# Patient Record
Sex: Female | Born: 1985 | Race: Black or African American | Hispanic: No | Marital: Single | Smoking: Current some day smoker
Health system: Southern US, Community
[De-identification: ages and names within clinical notes are randomized; demographics above are authoritative.]

## PROBLEM LIST (undated history)

## (undated) DIAGNOSIS — Z789 Other specified health status: Secondary | ICD-10-CM

## (undated) HISTORY — PX: NO PAST SURGERIES: SHX2092

---
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
## 2009-11-01 ENCOUNTER — Emergency Department (HOSPITAL_COMMUNITY): Admission: EM | Admit: 2009-11-01 | Discharge: 2009-11-02 | Payer: Self-pay | Admitting: Emergency Medicine

## 2009-12-03 ENCOUNTER — Emergency Department (HOSPITAL_COMMUNITY): Admission: EM | Admit: 2009-12-03 | Discharge: 2009-12-03 | Payer: Self-pay | Admitting: Emergency Medicine

## 2010-08-12 ENCOUNTER — Emergency Department (HOSPITAL_COMMUNITY)
Admission: EM | Admit: 2010-08-12 | Discharge: 2010-08-12 | Payer: Self-pay | Source: Home / Self Care | Admitting: Family Medicine

## 2010-11-14 LAB — WET PREP, GENITAL
Clue Cells Wet Prep HPF POC: NONE SEEN
Yeast Wet Prep HPF POC: NONE SEEN

## 2010-11-14 LAB — GC/CHLAMYDIA PROBE AMP, GENITAL
Chlamydia, DNA Probe: NEGATIVE
GC Probe Amp, Genital: NEGATIVE

## 2010-11-14 LAB — POCT URINALYSIS DIPSTICK
Bilirubin Urine: NEGATIVE
Glucose, UA: NEGATIVE mg/dL
Ketones, ur: NEGATIVE mg/dL
Nitrite: NEGATIVE
Protein, ur: NEGATIVE mg/dL
Specific Gravity, Urine: 1.02 (ref 1.005–1.030)
Urobilinogen, UA: 0.2 mg/dL (ref 0.0–1.0)
pH: 7 (ref 5.0–8.0)

## 2010-11-23 LAB — URINALYSIS, ROUTINE W REFLEX MICROSCOPIC
Bilirubin Urine: NEGATIVE
Ketones, ur: NEGATIVE mg/dL
Nitrite: POSITIVE — AB
Protein, ur: NEGATIVE mg/dL
Specific Gravity, Urine: 1.01 (ref 1.005–1.030)
Urobilinogen, UA: 0.2 mg/dL (ref 0.0–1.0)

## 2010-11-23 LAB — WET PREP, GENITAL
Trich, Wet Prep: NONE SEEN
Yeast Wet Prep HPF POC: NONE SEEN

## 2010-11-23 LAB — GC/CHLAMYDIA PROBE AMP, GENITAL
Chlamydia, DNA Probe: NEGATIVE
GC Probe Amp, Genital: NEGATIVE

## 2010-11-28 LAB — URINALYSIS, ROUTINE W REFLEX MICROSCOPIC
Bilirubin Urine: NEGATIVE
Ketones, ur: 15 mg/dL — AB
Nitrite: POSITIVE — AB
Protein, ur: NEGATIVE mg/dL
Specific Gravity, Urine: 1.022 (ref 1.005–1.030)
Urobilinogen, UA: 0.2 mg/dL (ref 0.0–1.0)

## 2010-11-28 LAB — POCT I-STAT, CHEM 8
BUN: 7 mg/dL (ref 6–23)
Creatinine, Ser: 1 mg/dL (ref 0.4–1.2)
Glucose, Bld: 89 mg/dL (ref 70–99)
Potassium: 3.7 mEq/L (ref 3.5–5.1)
Sodium: 137 mEq/L (ref 135–145)

## 2010-11-28 LAB — URINE MICROSCOPIC-ADD ON

## 2010-11-28 LAB — DIFFERENTIAL
Eosinophils Relative: 1 % (ref 0–5)
Lymphocytes Relative: 31 % (ref 12–46)
Lymphs Abs: 2 10*3/uL (ref 0.7–4.0)
Monocytes Relative: 7 % (ref 3–12)

## 2010-11-28 LAB — CBC
HCT: 41.7 % (ref 36.0–46.0)
Platelets: 353 10*3/uL (ref 150–400)
RBC: 4.57 MIL/uL (ref 3.87–5.11)
WBC: 6.4 10*3/uL (ref 4.0–10.5)

## 2010-11-28 LAB — GC/CHLAMYDIA PROBE AMP, GENITAL: GC Probe Amp, Genital: NEGATIVE

## 2010-11-28 LAB — WET PREP, GENITAL
Trich, Wet Prep: NONE SEEN
Yeast Wet Prep HPF POC: NONE SEEN

## 2010-12-23 ENCOUNTER — Inpatient Hospital Stay (INDEPENDENT_AMBULATORY_CARE_PROVIDER_SITE_OTHER)
Admission: RE | Admit: 2010-12-23 | Discharge: 2010-12-23 | Disposition: A | Discharge status: Home / Self Care | Payer: PRIVATE HEALTH INSURANCE | Source: Ambulatory Visit | Attending: Family Medicine | Admitting: Family Medicine

## 2010-12-23 DIAGNOSIS — N898 Other specified noninflammatory disorders of vagina: Secondary | ICD-10-CM

## 2010-12-23 DIAGNOSIS — N390 Urinary tract infection, site not specified: Secondary | ICD-10-CM

## 2010-12-23 LAB — POCT URINALYSIS DIP (DEVICE)
Nitrite: POSITIVE — AB
Protein, ur: >=300 mg/dL — AB
Urobilinogen, UA: 2 mg/dL — ABNORMAL HIGH (ref 0.0–1.0)
pH: 5.5 (ref 5.0–8.0)

## 2010-12-24 LAB — URINE CULTURE

## 2011-04-08 ENCOUNTER — Encounter (HOSPITAL_COMMUNITY): Payer: Self-pay | Admitting: Obstetrics and Gynecology

## 2011-04-08 ENCOUNTER — Inpatient Hospital Stay (HOSPITAL_COMMUNITY)
Admission: AD | Admit: 2011-04-08 | Discharge: 2011-04-08 | Disposition: A | Discharge status: Home / Self Care | Payer: Self-pay | Source: Ambulatory Visit | Attending: Obstetrics & Gynecology | Admitting: Obstetrics & Gynecology

## 2011-04-08 DIAGNOSIS — N750 Cyst of Bartholin's gland: Secondary | ICD-10-CM | POA: Insufficient documentation

## 2011-04-08 HISTORY — DX: Other specified health status: Z78.9

## 2011-04-08 LAB — URINALYSIS, ROUTINE W REFLEX MICROSCOPIC
Ketones, ur: NEGATIVE mg/dL
Leukocytes, UA: NEGATIVE
Nitrite: NEGATIVE
Specific Gravity, Urine: 1.02 (ref 1.005–1.030)
pH: 8.5 — ABNORMAL HIGH (ref 5.0–8.0)

## 2011-04-08 LAB — WET PREP, GENITAL
Clue Cells Wet Prep HPF POC: NONE SEEN
Trich, Wet Prep: NONE SEEN

## 2011-04-08 LAB — URINE MICROSCOPIC-ADD ON

## 2011-04-08 MED ORDER — DOXYCYCLINE HYCLATE 100 MG PO CAPS: 100.0000 mg | ORAL_CAPSULE | Freq: Two times a day (BID) | ORAL | Status: AC

## 2011-04-08 NOTE — Progress Notes (Signed)
Earlier last week had pain in rectal area now has lumps in vaginal area, painful, had some discharge earlier this week, none at present

## 2011-04-08 NOTE — Progress Notes (Signed)
Pt presents to [facility] with Lumps in vaginal area. Pt noticed the lumps 2 days ago. Pt states she is sexually active- pt states she uses condoms. Pt describes the lumps as very painful. They are painful when she walks, and when she has a bowel movement.

## 2011-04-08 NOTE — ED Provider Notes (Addendum)
History     Chief Complaint  Patient presents with  . Groin Pain   HPI  Patient presents with complaints of 2 knots in her vagina.  Reports as "sore" when she walks and sits.  Denis vaginal bleeding.  Saw discharge 2 days ago.  Has 1 partner but not sure is she is his only partner.  Last intercourse 2 weeks ago.  LMP end of June.  Not using birth control.     Past Medical History  Diagnosis Date  . No pertinent past medical history     Past Surgical History  Procedure Date  . No past surgeries     No family history on file.  History  Substance Use Topics  . Smoking status: Never Smoker   . Smokeless tobacco: Not on file  . Alcohol Use: Yes     "drink from time to time"    Allergies: No Known Allergies  Prescriptions prior to admission  Medication Sig Dispense Refill  . Ibuprofen (ADVIL PO) Take 2 tablets by mouth daily as needed. For headache.         Review of Systems  Constitutional: Negative.   Gastrointestinal: Negative for nausea and vomiting.  Genitourinary: Negative.        Vaginal discharge without odor 2 days ago Neg for vaginal bleeding.   Physical Exam   Blood pressure 117/75, pulse 85, temperature 99.9 F (37.7 C), temperature source Oral, resp. rate 16, height 5' 4.25" (1.632 m), weight 207 lb 12.8 oz (94.257 kg), last menstrual period 03/04/2011.  Physical Exam  Constitutional: She is oriented to person, place, and time. She appears well-developed and well-nourished.  Neck: Neck supple.  Respiratory: Effort normal.  GI: Soft. She exhibits no distension. There is no tenderness.  Genitourinary:    Uterus is tender. Uterus is not enlarged. Cervix exhibits friability. Cervix exhibits no motion tenderness and no discharge. No bleeding around the vagina. Vaginal discharge found.       Right nodule that is tender, slightly enlarged and firm.  Drainage not noted. Left Bartholins cyst --small area of bleeding.  It is flat and appears to have already  drained on its own.  It is tender to touch.    Lymphadenopathy:       Right: No inguinal adenopathy present.       Left: No inguinal adenopathy present.  Neurological: She is alert and oriented to person, place, and time.  Skin: Skin is warm and dry.    MAU Course  Procedures     Results for orders placed during the hospital encounter of 04/08/11 (from the past 24 hour(s))  URINALYSIS, ROUTINE W REFLEX MICROSCOPIC     Status: Abnormal   Collection Time   04/08/11 12:18 PM      Component Value Range   Color, Urine YELLOW  YELLOW    Appearance CLEAR  CLEAR    Specific Gravity, Urine 1.020  1.005 - 1.030    pH 8.5 (*) 5.0 - 8.0    Glucose, UA NEGATIVE  NEGATIVE (mg/dL)   Hgb urine dipstick SMALL (*) NEGATIVE    Bilirubin Urine NEGATIVE  NEGATIVE    Ketones, ur NEGATIVE  NEGATIVE (mg/dL)   Protein, ur NEGATIVE  NEGATIVE (mg/dL)   Urobilinogen, UA 0.2  0.0 - 1.0 (mg/dL)   Nitrite NEGATIVE  NEGATIVE    Leukocytes, UA NEGATIVE  NEGATIVE   URINE MICROSCOPIC-ADD ON     Status: Normal   Collection Time   04/08/11 12:18 PM  Component Value Range   Squamous Epithelial / LPF RARE  RARE    WBC, UA 0-2  <3 (WBC/hpf)   RBC / HPF 3-6  <3 (RBC/hpf)   Bacteria, UA RARE  RARE   POCT PREGNANCY, URINE     Status: Normal   Collection Time   04/08/11 12:21 PM      Component Value Range   Preg Test, Ur NEGATIVE    WET PREP, GENITAL     Status: Abnormal   Collection Time   04/08/11  1:07 PM      Component Value Range   Yeast, Wet Prep NONE SEEN  NONE SEEN    Trich, Wet Prep NONE SEEN  NONE SEEN    Clue Cells, Wet Prep NONE SEEN  NONE SEEN    WBC, Wet Prep HPF POC MANY (*) NONE SEEN      MDM   Assessment and Plan  Bartholin's Gland Cysts bilaterally    Doxycycline 100mg  po bid for 10 days Warm soaks 3-4 X day until resolved  Mianna Iezzi,EVE M 04/08/2011, 1:00 PM   Matt Holmes, NP 04/08/11 1721

## 2011-04-11 LAB — GC/CHLAMYDIA PROBE AMP, GENITAL: GC Probe Amp, Genital: POSITIVE — AB

## 2011-05-13 ENCOUNTER — Inpatient Hospital Stay (INDEPENDENT_AMBULATORY_CARE_PROVIDER_SITE_OTHER)
Admission: RE | Admit: 2011-05-13 | Discharge: 2011-05-13 | Disposition: A | Discharge status: Home / Self Care | Payer: BC Managed Care – PPO | Source: Ambulatory Visit | Attending: Emergency Medicine | Admitting: Emergency Medicine

## 2011-05-13 ENCOUNTER — Ambulatory Visit (INDEPENDENT_AMBULATORY_CARE_PROVIDER_SITE_OTHER): Payer: BC Managed Care – PPO

## 2011-05-13 DIAGNOSIS — M2550 Pain in unspecified joint: Secondary | ICD-10-CM

## 2011-05-13 LAB — POCT PREGNANCY, URINE: Preg Test, Ur: NEGATIVE

## 2011-05-28 ENCOUNTER — Inpatient Hospital Stay (INDEPENDENT_AMBULATORY_CARE_PROVIDER_SITE_OTHER)
Admission: RE | Admit: 2011-05-28 | Discharge: 2011-05-28 | Disposition: A | Discharge status: Home / Self Care | Payer: BC Managed Care – PPO | Source: Ambulatory Visit | Attending: Emergency Medicine | Admitting: Emergency Medicine

## 2011-05-28 DIAGNOSIS — R51 Headache: Secondary | ICD-10-CM

## 2011-10-20 ENCOUNTER — Encounter: Payer: Self-pay | Admitting: Obstetrics and Gynecology

## 2011-11-21 ENCOUNTER — Other Ambulatory Visit: Payer: Self-pay

## 2011-11-21 ENCOUNTER — Encounter: Payer: Self-pay | Admitting: Obstetrics and Gynecology

## 2011-12-07 ENCOUNTER — Encounter (INDEPENDENT_AMBULATORY_CARE_PROVIDER_SITE_OTHER): Payer: BC Managed Care – PPO | Admitting: Obstetrics and Gynecology

## 2011-12-07 ENCOUNTER — Encounter (INDEPENDENT_AMBULATORY_CARE_PROVIDER_SITE_OTHER): Payer: BC Managed Care – PPO

## 2011-12-07 DIAGNOSIS — N949 Unspecified condition associated with female genital organs and menstrual cycle: Secondary | ICD-10-CM

## 2011-12-07 DIAGNOSIS — E282 Polycystic ovarian syndrome: Secondary | ICD-10-CM

## 2013-10-13 ENCOUNTER — Emergency Department (HOSPITAL_COMMUNITY)
Admission: EM | Admit: 2013-10-13 | Discharge: 2013-10-13 | Disposition: A | Discharge status: Home / Self Care | Payer: BC Managed Care – PPO | Source: Home / Self Care | Attending: Family Medicine | Admitting: Family Medicine

## 2013-10-13 ENCOUNTER — Encounter (HOSPITAL_COMMUNITY): Payer: Self-pay | Admitting: Emergency Medicine

## 2013-10-13 DIAGNOSIS — J029 Acute pharyngitis, unspecified: Secondary | ICD-10-CM

## 2013-10-13 LAB — POCT RAPID STREP A: Streptococcus, Group A Screen (Direct): NEGATIVE

## 2013-10-13 MED ORDER — PENICILLIN V POTASSIUM 500 MG PO TABS: 500.0000 mg | ORAL_TABLET | Freq: Four times a day (QID) | ORAL | Status: DC

## 2013-10-13 NOTE — ED Provider Notes (Signed)
CSN: [id number]     Arrival date & time 10/13/13  1047 History   First MD Initiated Contact with Patient 10/13/13 1125     No chief complaint on file.    (Consider location/radiation/quality/duration/timing/severity/associated sxs/prior Treatment) Patient is a 28 y.o. female presenting with pharyngitis. The history is provided by the patient. No language interpreter was used.  Sore Throat This is a new problem. The problem occurs constantly. The problem has been gradually worsening. Pertinent negatives include no shortness of breath. Nothing aggravates the symptoms. Nothing relieves the symptoms. She has tried nothing for the symptoms. The treatment provided no relief.    Past Medical History  Diagnosis Date  . No pertinent past medical history    Past Surgical History  Procedure Laterality Date  . No past surgeries     No family history on file. History  Substance Use Topics  . Smoking status: Never Smoker   . Smokeless tobacco: Not on file  . Alcohol Use: Yes     Comment: "drink from time to time"   OB History   Grav Para Term Preterm Abortions TAB SAB Ect Mult Living   0 0 0 0 0 0 0 0 0 0      Review of Systems  HENT: Positive for ear pain and sore throat.   Respiratory: Positive for cough. Negative for shortness of breath.   All other systems reviewed and are negative.      Allergies  Review of patient's allergies indicates no known allergies.  Home Medications   Current Outpatient Rx  Name  Route  Sig  Dispense  Refill  . Ibuprofen (ADVIL PO)   Oral   Take 2 tablets by mouth daily as needed. For headache.           BP 124/59  Pulse 78  Temp(Src) 98.2 F (36.8 C) (Oral)  Resp 16  SpO2 100% Physical Exam  Nursing note and vitals reviewed. Constitutional: She is oriented to person, place, and time. She appears well-developed and well-nourished.  HENT:  Head: Normocephalic.  Right Ear: External ear normal.  Left Ear: External ear normal.  Throat  erythematous  Eyes: Conjunctivae and EOM are normal. Pupils are equal, round, and reactive to light.  Neck: Normal range of motion.  Pulmonary/Chest: Effort normal.  Abdominal: She exhibits no distension.  Musculoskeletal: Normal range of motion.  Neurological: She is alert and oriented to person, place, and time.  Skin: Skin is warm.  Psychiatric: She has a normal mood and affect.    ED Course  Procedures (including critical care time) Labs Review Labs Reviewed - No data to display Imaging Review No results found.    MDM   Final diagnoses:Pharyngitis  None    pcn vk [id number] qid    Lonia SkinnerLeslie K NezperceSofia, PA-C 10/13/13 1208  Lonia SkinnerLeslie K LykensSofia, [place] JerseyPA-C 10/13/13 1211

## 2013-10-13 NOTE — Discharge Instructions (Signed)
Sore Throat A sore throat is pain, burning, irritation, or scratchiness of the throat. There is often pain or tenderness when swallowing or talking. A sore throat may be accompanied by other symptoms, such as coughing, sneezing, fever, and swollen neck glands. A sore throat is often the first sign of another sickness, such as a cold, flu, strep throat, or mononucleosis (commonly known as mono). Most sore throats go away without medical treatment. CAUSES  The most common causes of a sore throat include:  A viral infection, such as a cold, flu, or mono.  A bacterial infection, such as strep throat, tonsillitis, or whooping cough.  Seasonal allergies.  Dryness in the air.  Irritants, such as smoke or pollution.  Gastroesophageal reflux disease (GERD). HOME CARE INSTRUCTIONS   Only take over-the-counter medicines as directed by your caregiver.  Drink enough fluids to keep your urine clear or pale yellow.  Rest as needed.  Try using throat sprays, lozenges, or sucking on hard candy to ease any pain (if older than 4 years or as directed).  Sip warm liquids, such as broth, herbal tea, or warm water with honey to relieve pain temporarily. You may also eat or drink cold or frozen liquids such as frozen ice pops.  Gargle with salt water (mix 1 tsp salt with 8 oz of water).  Do not smoke and avoid secondhand smoke.  Put a cool-mist humidifier in your bedroom at night to moisten the air. You can also turn on a hot shower and sit in the bathroom with the door closed for 5 10 minutes. SEEK IMMEDIATE MEDICAL CARE IF:  You have difficulty breathing.  You are unable to swallow fluids, soft foods, or your saliva.  You have increased swelling in the throat.  Your sore throat does not get better in 7 days.  You have nausea and vomiting.  You have a fever or persistent symptoms for more than 2 3 days.  You have a fever and your symptoms suddenly get worse. MAKE SURE YOU:   Understand  these instructions.  Will watch your condition.  Will get help right away if you are not doing well or get worse. Document Released: 09/28/2004 Document Revised: 08/07/2012 Document Reviewed: 04/28/2012 Exit[employer] Patient Information 2014 [employer].  

## 2013-10-13 NOTE — ED Notes (Signed)
Pt  Reports           sorethroat    Headache   As   Well              Clogged  Ears        The  sorethroat               Has  Been  For  About  1  Week

## 2013-10-15 LAB — CULTURE, GROUP A STREP

## 2013-10-15 NOTE — ED Provider Notes (Signed)
Medical screening examination/treatment/procedure(s) were performed by a resident physician or non-physician practitioner and as the supervising physician I was immediately available for consultation/collaboration.  Latesa Fratto, MD    Boneta Standre S Nahom Carfagno, MD 10/15/13 0750 

## 2014-04-11 ENCOUNTER — Encounter (HOSPITAL_COMMUNITY): Payer: Self-pay | Admitting: Emergency Medicine

## 2014-04-11 ENCOUNTER — Emergency Department (HOSPITAL_COMMUNITY)
Admission: EM | Admit: 2014-04-11 | Discharge: 2014-04-11 | Disposition: A | Discharge status: Home / Self Care | Payer: BC Managed Care – PPO | Attending: Emergency Medicine | Admitting: Emergency Medicine

## 2014-04-11 ENCOUNTER — Emergency Department (INDEPENDENT_AMBULATORY_CARE_PROVIDER_SITE_OTHER)
Admission: EM | Admit: 2014-04-11 | Discharge: 2014-04-11 | Disposition: A | Discharge status: Nursing Home (Includes ICF) | Payer: BC Managed Care – PPO | Source: Home / Self Care | Attending: Emergency Medicine | Admitting: Emergency Medicine

## 2014-04-11 ENCOUNTER — Emergency Department (HOSPITAL_COMMUNITY): Payer: BC Managed Care – PPO

## 2014-04-11 DIAGNOSIS — R000 Tachycardia, unspecified: Secondary | ICD-10-CM | POA: Insufficient documentation

## 2014-04-11 DIAGNOSIS — E669 Obesity, unspecified: Secondary | ICD-10-CM | POA: Insufficient documentation

## 2014-04-11 DIAGNOSIS — R599 Enlarged lymph nodes, unspecified: Secondary | ICD-10-CM | POA: Insufficient documentation

## 2014-04-11 DIAGNOSIS — K122 Cellulitis and abscess of mouth: Secondary | ICD-10-CM

## 2014-04-11 DIAGNOSIS — R509 Fever, unspecified: Secondary | ICD-10-CM | POA: Insufficient documentation

## 2014-04-11 DIAGNOSIS — R609 Edema, unspecified: Secondary | ICD-10-CM | POA: Insufficient documentation

## 2014-04-11 DIAGNOSIS — R51 Headache: Secondary | ICD-10-CM | POA: Insufficient documentation

## 2014-04-11 DIAGNOSIS — J029 Acute pharyngitis, unspecified: Secondary | ICD-10-CM | POA: Insufficient documentation

## 2014-04-11 DIAGNOSIS — S01532A Puncture wound without foreign body of oral cavity, initial encounter: Secondary | ICD-10-CM

## 2014-04-11 DIAGNOSIS — K140 Glossitis: Secondary | ICD-10-CM | POA: Insufficient documentation

## 2014-04-11 LAB — I-STAT CHEM 8, ED
BUN: 8 mg/dL (ref 6–23)
CALCIUM ION: 1.18 mmol/L (ref 1.12–1.23)
Chloride: 103 mEq/L (ref 96–112)
Creatinine, Ser: 0.8 mg/dL (ref 0.50–1.10)
Glucose, Bld: 81 mg/dL (ref 70–99)
HEMATOCRIT: 46 % (ref 36.0–46.0)
Hemoglobin: 15.6 g/dL — ABNORMAL HIGH (ref 12.0–15.0)
Potassium: 3.6 mEq/L — ABNORMAL LOW (ref 3.7–5.3)
Sodium: 140 mEq/L (ref 137–147)
TCO2: 23 mmol/L (ref 0–100)

## 2014-04-11 LAB — CBC WITH DIFFERENTIAL/PLATELET
BASOS ABS: 0 10*3/uL (ref 0.0–0.1)
BASOS PCT: 0 % (ref 0–1)
EOS PCT: 1 % (ref 0–5)
Eosinophils Absolute: 0.1 10*3/uL (ref 0.0–0.7)
HEMATOCRIT: 40.2 % (ref 36.0–46.0)
Hemoglobin: 13.6 g/dL (ref 12.0–15.0)
Lymphocytes Relative: 24 % (ref 12–46)
Lymphs Abs: 2.5 10*3/uL (ref 0.7–4.0)
MCH: 28.1 pg (ref 26.0–34.0)
MCHC: 33.8 g/dL (ref 30.0–36.0)
MCV: 83.1 fL (ref 78.0–100.0)
MONO ABS: 0.6 10*3/uL (ref 0.1–1.0)
Monocytes Relative: 6 % (ref 3–12)
Neutro Abs: 7 10*3/uL (ref 1.7–7.7)
Neutrophils Relative %: 69 % (ref 43–77)
Platelets: 380 10*3/uL (ref 150–400)
RBC: 4.84 MIL/uL (ref 3.87–5.11)
RDW: 13.3 % (ref 11.5–15.5)
WBC: 10.2 10*3/uL (ref 4.0–10.5)

## 2014-04-11 LAB — I-STAT CG4 LACTIC ACID, ED: LACTIC ACID, VENOUS: 1.26 mmol/L (ref 0.5–2.2)

## 2014-04-11 LAB — POCT RAPID STREP A: STREPTOCOCCUS, GROUP A SCREEN (DIRECT): NEGATIVE

## 2014-04-11 MED ORDER — IOHEXOL 300 MG/ML  SOLN
75.0000 mL | Freq: Once | INTRAMUSCULAR | Status: AC | PRN
  Administered 2014-04-11: 75 mL via INTRAVENOUS

## 2014-04-11 MED ORDER — CLINDAMYCIN PHOSPHATE 600 MG/50ML IV SOLN
600.0000 mg | Freq: Once | INTRAVENOUS | Status: AC
  Administered 2014-04-11: 600 mg via INTRAVENOUS
  Filled 2014-04-11: qty 50

## 2014-04-11 MED ORDER — ONDANSETRON HCL 4 MG/2ML IJ SOLN
4.0000 mg | Freq: Once | INTRAMUSCULAR | Status: AC
  Administered 2014-04-11: 4 mg via INTRAVENOUS
  Filled 2014-04-11: qty 2

## 2014-04-11 MED ORDER — CLINDAMYCIN HCL 150 MG PO CAPS: 150.0000 mg | ORAL_CAPSULE | Freq: Four times a day (QID) | ORAL | Status: AC

## 2014-04-11 MED ORDER — HYDROCODONE-ACETAMINOPHEN 5-325 MG PO TABS: 1.0000 | ORAL_TABLET | Freq: Four times a day (QID) | ORAL | Status: AC | PRN

## 2014-04-11 MED ORDER — MORPHINE SULFATE 4 MG/ML IJ SOLN
4.0000 mg | Freq: Once | INTRAMUSCULAR | Status: AC
  Administered 2014-04-11: 4 mg via INTRAVENOUS
  Filled 2014-04-11: qty 1

## 2014-04-11 MED ORDER — LIDOCAINE-EPINEPHRINE-TETRACAINE (LET) SOLUTION
3.0000 mL | Freq: Once | NASAL | Status: AC
  Administered 2014-04-11: 3 mL via TOPICAL
  Filled 2014-04-11: qty 3

## 2014-04-11 NOTE — Discharge Instructions (Signed)
You have an infected tongue piercing.  Take antibiotic as prescribed for the full duration.  Take pain medication as needed.  Gargle with salt water several times daily to aid with healing.  Follow up with your doctor for further care.  Return to ER if your condition worsen or if you have other concerns.    Wound Infection A wound infection happens when a type of germ (bacteria) starts growing in the wound. In some cases, this can cause the wound to break open. If cared for properly, the infected wound will heal from the inside to the outside. Wound infections need treatment. CAUSES An infection is caused by bacteria growing in the wound.  SYMPTOMS   Increase in redness, swelling, or pain at the wound site.  Increase in drainage at the wound site.  Wound or bandage (dressing) starts to smell bad.  Fever.  Feeling tired or fatigued.  Pus draining from the wound. TREATMENT  Your health care provider will prescribe antibiotic medicine. The wound infection should improve within 24 to 48 hours. Any redness around the wound should stop spreading and the wound should be less painful.  HOME CARE INSTRUCTIONS   Only take over-the-counter or prescription medicines for pain, discomfort, or fever as directed by your health care provider.  Take your antibiotics as directed. Finish them even if you start to feel better.  Gently wash the area with mild soap and water 2 times a day, or as directed. Rinse off the soap. Pat the area dry with a clean towel. Do not rub the wound. This may cause bleeding.  Follow your health care provider's instructions for how often you need to change the dressing.  Apply ointment and a dressing to the wound as directed.  If the dressing sticks, moisten it with soapy water and gently remove it.  Change the bandage right away if it becomes wet, dirty, or develops a bad smell.  Take showers. Do not take tub baths, swim, or do anything that may soak the wound until it  is healed.  Avoid exercises that make you sweat heavily.  Use anti-itch medicine as directed by your health care provider. The wound may itch when it is healing. Do not pick or scratch at the wound.  Follow up with your health care provider to get your wound rechecked as directed. SEEK MEDICAL CARE IF:  You have an increase in swelling, pain, or redness around the wound.  You have an increase in the amount of pus coming from the wound.  There is a bad smell coming from the wound.  More of the wound breaks open.  You have a fever. MAKE SURE YOU:   Understand these instructions.  Will watch your condition.  Will get help right away if you are not doing well or get worse. Document Released: 05/20/2003 Document Revised: 08/26/2013 Document Reviewed: 12/25/2010 [facility] Patient Information 2015 [place], [place]. This information is not intended to replace advice given to you by your health care provider. Make sure you discuss any questions you have with your health care provider.

## 2014-04-11 NOTE — ED Notes (Signed)
Patient transported to CT 

## 2014-04-11 NOTE — ED Provider Notes (Signed)
CSN: [id number]     Arrival date & time 04/11/14  1947 History   First MD Initiated Contact with Patient 04/11/14 2020     Chief Complaint  Patient presents with  . 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  Patient has piercing to the frenulum of the tongue approximately 3 years ago. For the past 2-3 days she has noticed increasing pain, swelling at the piercing site. Pain is described as a soreness, 8/10, persistent, radiates to both the ears and down to her neck. Endorse subjective fever. Complaining of some difficulty swallowing but no trouble breathing. Endorse a mild headache. No specific treatment tried. No recent injury. No runny nose, sneezing coughing hearing changes, chest pain, shortness of breath, or productive cough.  Past Medical History  Diagnosis Date  . No pertinent past medical history    Past Surgical History  Procedure Laterality Date  . No past surgeries     History reviewed. No pertinent family history. History  Substance Use Topics  . Smoking status: Never Smoker   . Smokeless tobacco: Not on file  . Alcohol Use: Yes     Comment: occasional   OB History   Grav Para Term Preterm Abortions TAB SAB Ect Mult Living   0 0 0 0 0 0 0 0 0 0      Review of Systems  Constitutional: Positive for fever.  HENT: Positive for sore throat. Negative for trouble swallowing.   Neurological: Positive for headaches.      Allergies  Review of patient's allergies indicates no known allergies.  Home Medications   Prior to Admission medications   Medication Sig Start Date End Date Taking? Authorizing Provider  ibuprofen (ADVIL,MOTRIN) 200 MG tablet Take 400 mg by mouth every 6 (six) hours as needed for headache or moderate pain.   Yes Historical Provider, MD    Pulse 88  Temp(Src) 99 F (37.2 C) (Oral)  Resp 14  Wt 250 lb (113.399 kg)  SpO2 100%  LMP 04/04/2014 Physical Exam  Nursing note and vitals reviewed. Constitutional: She appears well-developed and well-nourished. No distress.  Moderately obese African American female appears to be in no acute distress, nontoxic in appearance  HENT:  Head: Atraumatic.  Right Ear: External ear normal.  Left Ear: External ear normal.  Patient has infected piercing at the frenulum of the tongue with surrounding erythema, edema to the floor of the mouth, tender to palpation with pustular exudates. No trismus, uvula is midline, no tonsillar enlargement or exudates.  Eyes: Conjunctivae are normal.  Neck: Neck supple.  Cardiovascular:  Mild tachycardia without murmur rubs or gallop  Pulmonary/Chest: Effort normal and breath sounds normal.  Abdominal: Soft.  Lymphadenopathy:    She has cervical adenopathy.  Neurological: She is alert.  Skin: No rash noted.  Psychiatric: She has a normal mood and affect.    ED Course  Procedures (including critical care time)  8:44 PM Pt with infected frenulum of tongue.  [facility] has evaluated pt and was worried of ludwig angina.  Pt does not look toxic, however i will initiated abx, check basic labs, and will perform neck CT scan to r/o deep tissue infection.    11:48 PM Labs are reassuring, CT shows no evidence of abscess or other concerning finding.  I did attempt to remove the piercing without success due to surrounding swelling and  pain, even after applying LET.  Pt prefers to f/u with PCP in the next few days for recheck and piercing removal after being treated with abx.  Pt will be discharge with clindamycin, vicodin and standard return precaution.  She is able to tolerates PO.    Labs Review Labs Reviewed  I-STAT CHEM 8, ED - Abnormal; Notable for the following:    Potassium 3.6 (*)    Hemoglobin 15.6 (*)    All other components within normal limits   CULTURE, GROUP A STREP  CBC WITH DIFFERENTIAL  I-STAT CG4 LACTIC ACID, ED    Imaging Review Ct Soft Tissue Neck W Contrast  04/11/2014   CLINICAL DATA:  Tongue pain began 2 days ago, went to a yesterday, today with temperature of 99 degrees of bilateral ear ache with bilateral eustachian tube discomfort, painful and swollen tongue, sore throat, reports infection from a piercing  EXAM: CT NECK WITH CONTRAST  TECHNIQUE: Multidetector CT imaging of the neck was performed using the standard protocol following the bolus administration of intravenous contrast.  CONTRAST:  75mL OMNIPAQUE IOHEXOL 300 MG/ML  SOLN  COMPARISON:  None.  FINDINGS: Nasal piercing noted. No soft tissue abnormalities in this region. There is a piercing of the tongue the. The muscles of the floor of the mouth demonstrate mild low-attenuation. There is no evidence of abscess or airway narrowing. Uvula and epiglottis are normal. Larynx is normal. Thyroid gland is normal. No supraclavicular adenopathy. Lung apices clear. Major salivary glands are symmetric bilaterally. Numerous submandibular lymph nodes noted, measuring up to 9 mm in short axis. These are seen bilaterally. No significant carotid chain adenopathy.  IMPRESSION: Mild nonspecific decreased attenuation in the muscles involving the floor of the mouth. This suggests mild edematous change. There is no evidence of abscess or airway narrowing.   Electronically Signed   By: Esperanza Heir M.D.   On: 04/11/2014 23:09     EKG Interpretation None      MDM   Final diagnoses:  Infected pierced tongue    BP 130/88  Pulse 92  Temp(Src) 99 F (37.2 C) (Oral)  Resp 16  Wt 250 lb (113.399 kg)  SpO2 100%  LMP 04/04/2014  I have reviewed nursing notes and vital signs. I personally reviewed the imaging tests through PACS system  I reviewed available ER/hospitalization records thought the EMR     Fayrene Helper, PA-C 04/11/14 2350

## 2014-04-11 NOTE — ED Notes (Signed)
Report called to Freida BusmanAllen, ED First Nurse.

## 2014-04-11 NOTE — ED Provider Notes (Signed)
Medical screening examination/treatment/procedure(s) were performed by non-physician practitioner and as supervising physician I was immediately available for consultation/collaboration.   EKG Interpretation None        Olivia Royse H Jamell Laymon, MD 04/11/14 2356 

## 2014-04-11 NOTE — Discharge Instructions (Signed)
We have determined that your problem requires further evaluation in the emergency department.  We will take care of your transport there.  Once at the emergency department, you will be evaluated by a provider and they will order whatever treatment or tests they deem necessary.  We cannot guarantee that they will do any specific test or do any specific treatment.  ° °

## 2014-04-11 NOTE — ED Notes (Signed)
Patient returned from CT

## 2014-04-11 NOTE — ED Provider Notes (Signed)
Chief Complaint   Chief Complaint  Patient presents with  . Sore Throat    History of Present Illness   Andrea Copeland is a 28 year old female who has had a history since this morning of swelling of the floor the mouth, the tongue, and the neck. She's had a low-grade fever of just below 100 and, chills, sweats, and she does have some difficulty swallowing but no difficulty breathing. She has pain referred to both ears and a slight headache. She has a piercing of the frenulum of the tongue about 3 years ago and has not given her any problem since that. She denies any sore throat.  Review of Systems   Other than as noted above, the patient denies any of the following symptoms: Systemic:  No fevers or chills. Eye:  No redness, pain, discharge, itching, blurred vision, or diplopia. ENT:  No headache, nasal congestion, sneezing, itching, epistaxis, ear pain, decreased hearing, ringing in ears, vertigo, or tinnitus.  No oral lesions, sore throat, or hoarseness. Neck:  No neck pain or adenopathy. Skin:  No rash or itching.  PMFSH   Past medical history, family history, social history, meds, and allergies were reviewed.   Physical Examination     Vital signs:  BP 138/81  Pulse 100  Temp(Src) 98.9 F (37.2 C) (Oral)  SpO2 97%  LMP 04/04/2014 General:  Alert and oriented.  In no distress.  Skin warm and dry. Eye:  PERRL, full EOMs, lids and conjunctiva normal.   ENT:  TMs and canals clear.  Nasal mucosa not congested and without drainage.  Mucous membranes moist, no oral lesions, normal dentition, pharynx clear.  No cranial or facial pain to palplation. There is no pain or swelling over the mastoid. Exam of the mouth reveals a piercing on the floor the mouth of the frenulum of the tongue. There is surrounding erythema and swelling of the floor the mouth which is elevated the tongue slightly. Her tongue is tender to palpation. There is slight swelling of the submandibular area on the neck and  tenderness to palpation. The pharynx itself is clear the airway is patent. Neck:  Supple, full ROM.  No adenopathy, tenderness or mass.  Thyroid normal. Lungs:  Breath sounds clear and equal bilaterally.  No wheezes, rales or rhonchi. Heart:  Rhythm regular, without extrasystoles.  No gallops or murmers. Skin:  Clear, warm and dry.  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  She appears to have a delayed infection of a piercing of the frenulum of the tongue with swelling of the floor the mouth extending into the tongue and the submandibular space in the neck consistent with Ludwig's angina.  Plan    The patient was transferred to the ED via shuttle in stable condition.  Medical Decision Making:  28 year old female has a 1 day history of pain and swelling of tongue and floor of mouth.  She had a piercing of frenulum of tongue 3 years ago.  On exam the piercing is infected with swelling of floor of mouth consistent with Ludwig's angina.  She has a low grade fever and some difficulty swallowing, but no difficulty breathing at this time.       Reuben Likesavid C Jeet Shough,  MD 04/11/14 1942

## 2014-04-11 NOTE — ED Notes (Addendum)
Tongue piercing; underside of tongue is red and swollen. No drainage from site. Had piercing for x 3 years. No air way compromise.

## 2014-04-11 NOTE — ED Notes (Signed)
Called CT to ask about delay. There are 3 in front of her at this time.

## 2014-04-11 NOTE — ED Notes (Signed)
C/O starting with tongue pain 2 days ago, which went away yesterday.  Today had temp of 99.0, has bilat earache, describes bilat eustachian tube discomfort, painful & swollen tongue, sore throat.  Has applied cold compresses.  Has not taken any meds.  Declines Motrin at this time.

## 2014-04-13 LAB — CULTURE, GROUP A STREP

## 2015-02-19 IMAGING — CT CT NECK W/ CM
4 of 5 series · 15 of 33 positions shown, 17 images · IV contrast (Omni 300)
Comparison: None.

CLINICAL DATA: Tongue pain began 2 days ago, went to a yesterday,
today with temperature of 99 degrees of bilateral ear ache with
bilateral eustachian tube discomfort, painful and swollen tongue,
sore throat, reports infection from a piercing

EXAM:
CT NECK WITH CONTRAST
TECHNIQUE: Multidetector CT imaging of the neck was performed using the
standard protocol following the bolus administration of intravenous
contrast.
CONTRAST:  75mL OMNIPAQUE IOHEXOL 300 MG/ML  SOLN

[Series 2: neck 2.0 i31s 3 · axial · 0.43mm/px · z∈[-247,-105]mm · 4 of 119 slices shown, 5 images]
[im 24/119  soft-tissue]
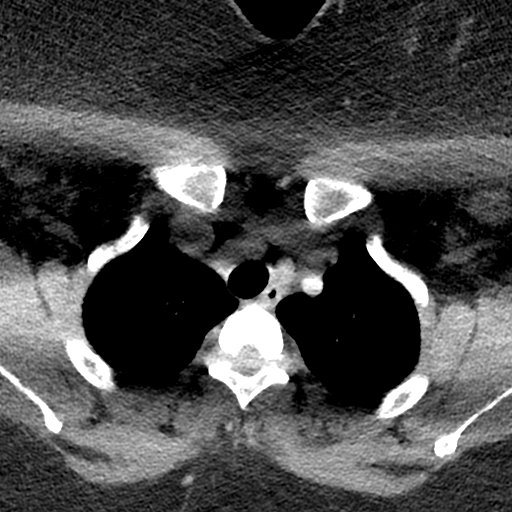
[im 24/119  bone]
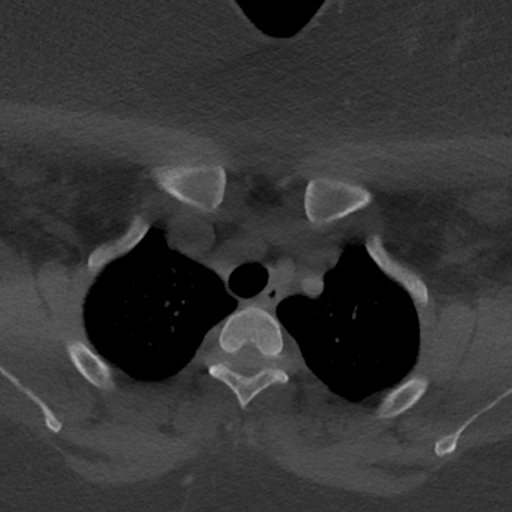
[im 48/119  bone]
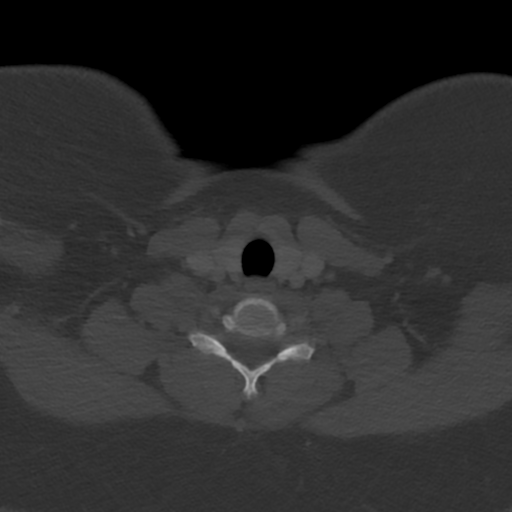
[im 71/119  bone]
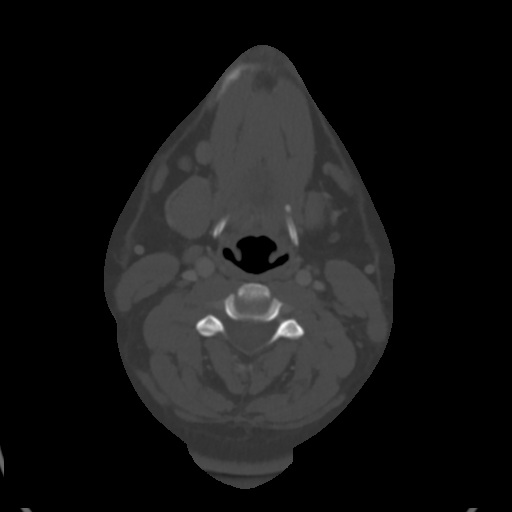
[im 95/119  bone]
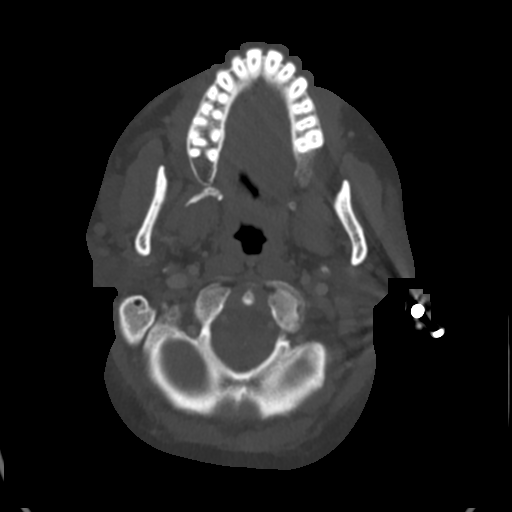

[Series 5: coronal st · coronal · 0.45mm/px · 3 of 78 slices shown]
[im 16/78  bone]
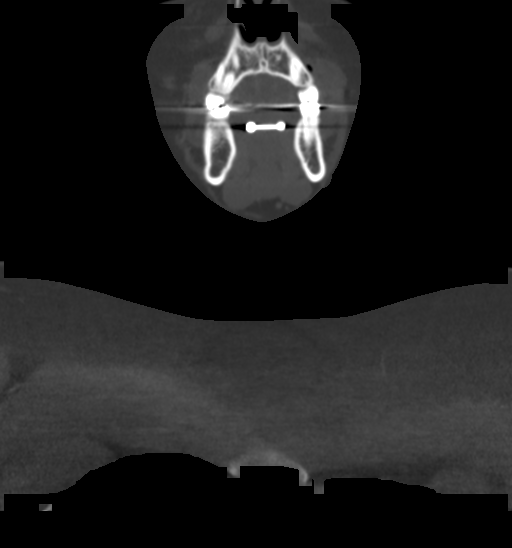
[im 31/78  bone]
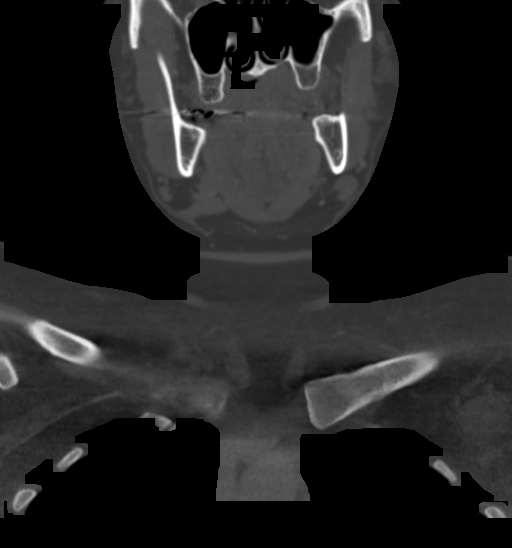
[im 47/78  bone]
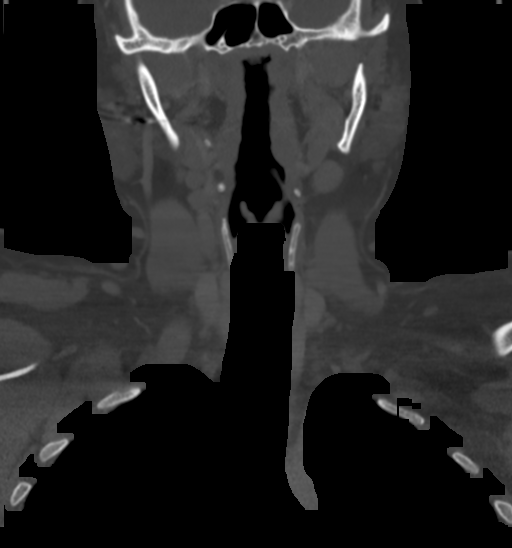

[Series 6: sagittal st · sagittal · 0.45mm/px · 5 of 67 slices shown, 6 images]
[im 23/67  bone]
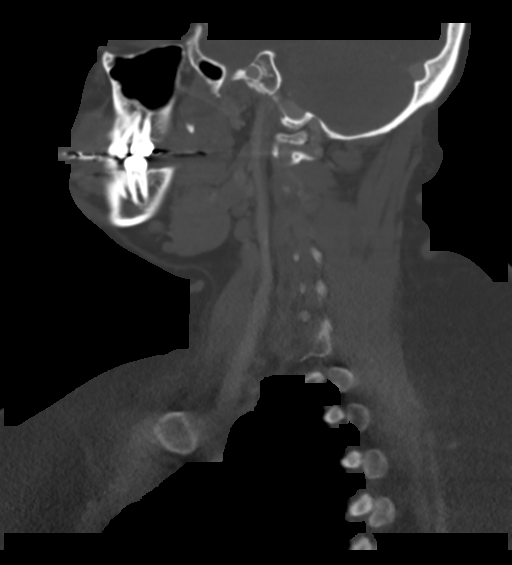
[im 28/67  bone]
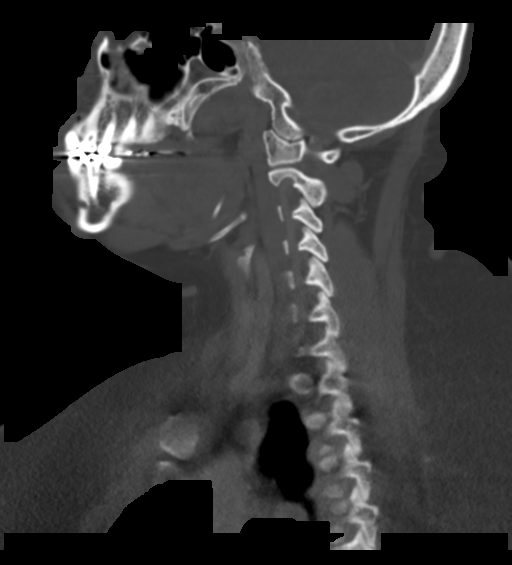
[im 34/67  soft-tissue]
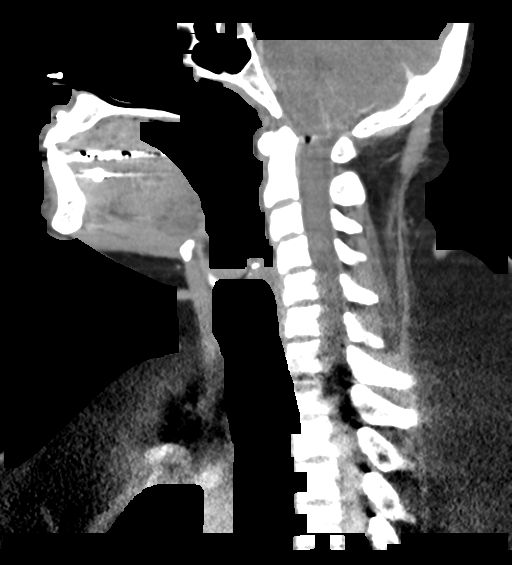
[im 34/67  bone]
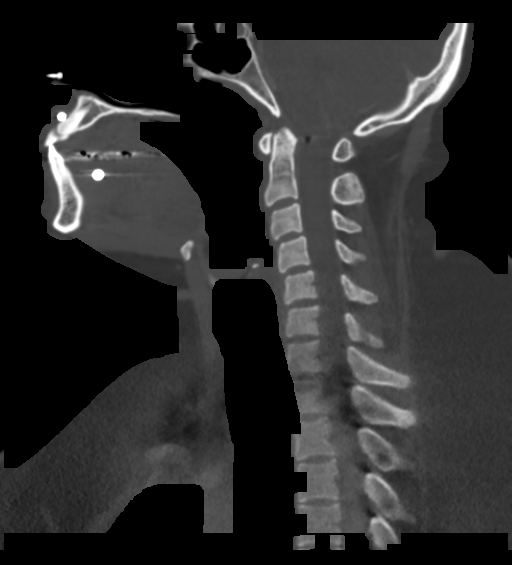
[im 39/67  bone]
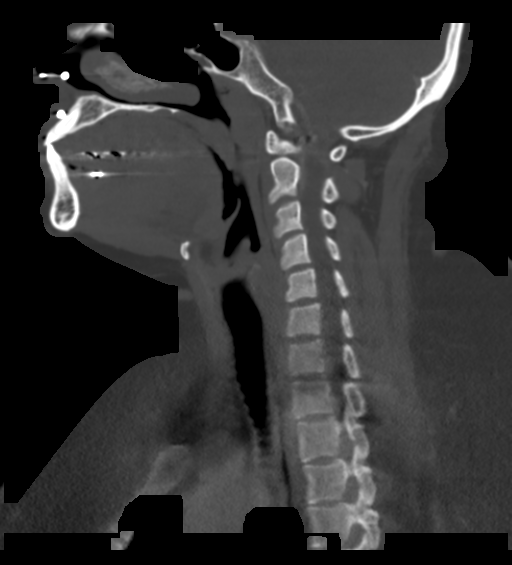
[im 45/67  bone]
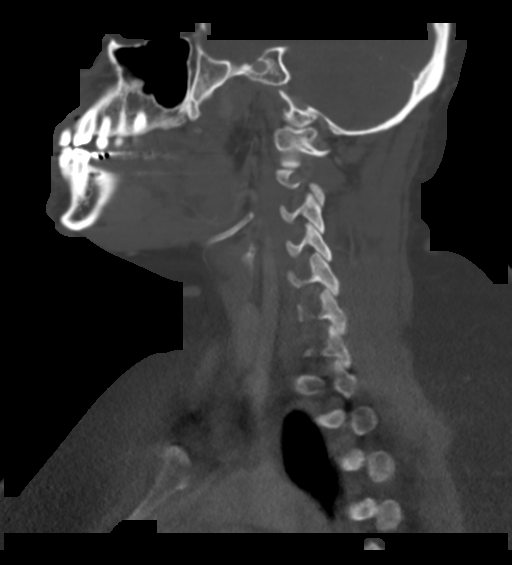

[Series 7: orthogonal st · axial · 0.38mm/px · z∈[-254,-165]mm · 3 of 114 slices shown]
[im 23/114  bone]
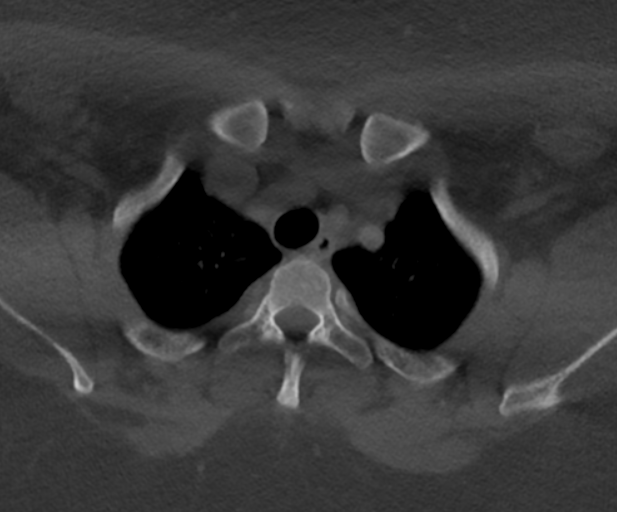
[im 46/114  bone]
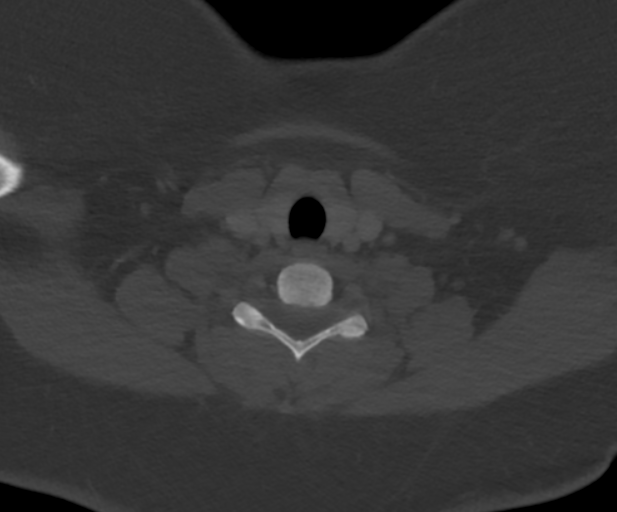
[im 68/114  bone]
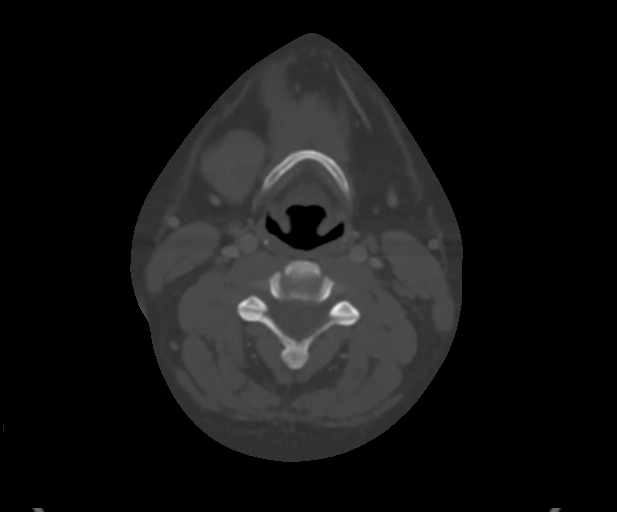

[15 of 33 positions shown; findings below may reference images not displayed]

FINDINGS: Nasal piercing noted. No soft tissue abnormalities in this region.
There is a piercing of the tongue the. The muscles of the floor of
the mouth demonstrate mild low-attenuation. There is no evidence of
abscess or airway narrowing. Uvula and epiglottis are normal. Larynx
is normal. Thyroid gland is normal. No supraclavicular adenopathy.
Lung apices clear. Major salivary glands are symmetric bilaterally.
Numerous submandibular lymph nodes noted, measuring up to 9 mm in
short axis. These are seen bilaterally. No significant carotid chain
adenopathy.
IMPRESSION: Mild nonspecific decreased attenuation in the muscles involving the
floor of the mouth. This suggests mild edematous change. There is no
evidence of abscess or airway narrowing.

## 2022-11-28 ENCOUNTER — Other Ambulatory Visit: Payer: Self-pay

## 2022-11-28 ENCOUNTER — Encounter (HOSPITAL_COMMUNITY): Payer: Self-pay

## 2022-11-28 ENCOUNTER — Emergency Department (HOSPITAL_COMMUNITY)
Admission: EM | Admit: 2022-11-28 | Discharge: 2022-11-28 | Disposition: A | Discharge status: Home / Self Care | Payer: Self-pay | Attending: Emergency Medicine | Admitting: Emergency Medicine

## 2022-11-28 DIAGNOSIS — K047 Periapical abscess without sinus: Secondary | ICD-10-CM | POA: Insufficient documentation

## 2022-11-28 MED ORDER — DEXAMETHASONE SODIUM PHOSPHATE 10 MG/ML IJ SOLN
10.0000 mg | Freq: Once | INTRAMUSCULAR | Status: AC
  Administered 2022-11-28: 10 mg via INTRAMUSCULAR
  Filled 2022-11-28: qty 1

## 2022-11-28 MED ORDER — AMOXICILLIN-POT CLAVULANATE 875-125 MG PO TABS
1.0000 | ORAL_TABLET | Freq: Once | ORAL | Status: AC
  Administered 2022-11-28: 1 via ORAL
  Filled 2022-11-28: qty 1

## 2022-11-28 MED ORDER — AMOXICILLIN-POT CLAVULANATE 875-125 MG PO TABS: 1.0000 | ORAL_TABLET | Freq: Two times a day (BID) | ORAL | 0 refills | Status: DC

## 2022-11-28 MED ORDER — IBUPROFEN 800 MG PO TABS: 800.0000 mg | ORAL_TABLET | Freq: Three times a day (TID) | ORAL | 0 refills | Status: AC

## 2022-11-28 MED ORDER — AMOXICILLIN-POT CLAVULANATE 875-125 MG PO TABS: 1.0000 | ORAL_TABLET | Freq: Two times a day (BID) | ORAL | 0 refills | Status: AC

## 2022-11-28 MED ORDER — IBUPROFEN 800 MG PO TABS: 800.0000 mg | ORAL_TABLET | Freq: Three times a day (TID) | ORAL | 0 refills | Status: DC

## 2022-11-28 NOTE — ED Triage Notes (Signed)
Pt c/o left lower teeth pain. Pt states she has an infection in her left lower tooth. Pt sttes the infection has been there for awhile. Pt c/o left ear painx3d.

## 2022-11-28 NOTE — Discharge Instructions (Addendum)
You are seen today in the emergency department due to dental pain.  Take the Augmentin twice daily for the next of days, the first dose was given here in the emergency department.  Take second dose in the evening with food and water.  Take ibuprofen Atripla grams every 8 hours as needed for pain, may also take Tylenol in addition 1000 milligrams up to 3 times daily or every 8 hours.  Steroid given today in emergency room hold his ablation over the next 2 days.

## 2022-11-28 NOTE — ED Provider Notes (Signed)
[facility] Provider Note   CSN: [id number] Arrival date & time: 11/28/22  1132     History  Chief Complaint  Patient presents with   Dental Pain    Andrea Copeland is a 37 y.o. female.   Dental Pain    Patient presents due to dental pain.  This is been going on for the year, month ago she was having significant dental pain but it resolved until 3 days ago.  She left side of her mouth, worse with chewing.  It radiates to her left ear, no facial swelling, no recent antibiotic use, no dental trauma.  Home Medications Prior to Admission medications   Medication Sig Start Date End Date Taking? Authorizing Provider  amoxicillin-clavulanate (AUGMENTIN) 875-125 MG tablet Take 1 tablet by mouth every 12 (twelve) hours. 11/28/22   Sherrill Raring, PA-C  clindamycin (CLEOCIN) 150 MG capsule Take 1 capsule (150 mg total) by mouth every 6 (six) hours. 04/11/14   Domenic Moras, PA-C  HYDROcodone-acetaminophen (NORCO/VICODIN) 5-325 MG per tablet Take 1 tablet by mouth every 6 (six) hours as needed for moderate pain or severe pain. 04/11/14   Domenic Moras, PA-C  ibuprofen (ADVIL) 800 MG tablet Take 1 tablet (800 mg total) by mouth 3 (three) times daily. 11/28/22   Sherrill Raring, PA-C      Allergies    Patient has no known allergies.    Review of Systems   Review of Systems  Physical Exam Updated Vital Signs BP (!) 117/91 (BP Location: Right Arm)   Pulse 83   Temp 98.8 F (37.1 C)   Resp 16   Ht 5' 4.25" (1.632 m)   Wt 113.4 kg   SpO2 94%   BMI 42.58 kg/m  Physical Exam Vitals and nursing note reviewed. Exam conducted with a chaperone present.  Constitutional:      General: She is not in acute distress.    Appearance: Normal appearance.  HENT:     Head: Normocephalic and atraumatic.     Mouth/Throat:   Eyes:     General: No scleral icterus.    Extraocular Movements: Extraocular movements intact.     Pupils: Pupils are equal, round, and  reactive to light.  Skin:    Coloration: Skin is not jaundiced.  Neurological:     Mental Status: She is alert. Mental status is at baseline.     Coordination: Coordination normal.     ED Results / Procedures / Treatments   Labs (all labs ordered are listed, but only abnormal results are displayed) Labs Reviewed - No data to display  EKG None  Radiology No results found.  Procedures Procedures    Medications Ordered in ED Medications  dexamethasone (DECADRON) injection 10 mg (10 mg Intramuscular Given 11/28/22 1154)  amoxicillin-clavulanate (AUGMENTIN) 875-125 MG per tablet 1 tablet (1 tablet Oral Given 11/28/22 1154)    ED Course/ Medical Decision Making/ A&P                             Medical Decision Making Risk Prescription drug management.   Patient presents the emergency department due to dental pain.  On exam she is developing apical abscess along the chipped tooth, she is generally poor dentition but no evidence of peritonsillar abscess, retropharyngeal abscess, Ludwig angina.  Clinically, no SIRS criteria and does not appear septic.  She is tolerating p.o.  No change in phonation, no muffled voice.  Will give IM Decadron for inflammation and first dose of Augmentin here.  Will discharge with dental follow-up, steroids for abscess and strict return precautions.  Patient stable for outpatient follow-up.        Final Clinical Impression(s) / ED Diagnoses Final diagnoses:  Dental abscess    Rx / DC Orders ED Discharge Orders          Ordered    amoxicillin-clavulanate (AUGMENTIN) 875-125 MG tablet  Every 12 hours,   Status:  Discontinued        11/28/22 1148    ibuprofen (ADVIL) 800 MG tablet  3 times daily,   Status:  Discontinued        11/28/22 1148    amoxicillin-clavulanate (AUGMENTIN) 875-125 MG tablet  Every 12 hours        11/28/22 1209    ibuprofen (ADVIL) 800 MG tablet  3 times daily        11/28/22 1209              Sherrill Raring,  PA-C 11/28/22 1259    Tretha Sciara, MD 11/28/22 [phone]
